# Patient Record
Sex: Female | Born: 1960 | Hispanic: No | Marital: Single | Smoking: Former smoker
Health system: Southern US, Community
[De-identification: ages and names within clinical notes are randomized; demographics above are authoritative.]

## PROBLEM LIST (undated history)

## (undated) DIAGNOSIS — Z87448 Personal history of other diseases of urinary system: Secondary | ICD-10-CM

## (undated) DIAGNOSIS — E119 Type 2 diabetes mellitus without complications: Secondary | ICD-10-CM

## (undated) DIAGNOSIS — I10 Essential (primary) hypertension: Secondary | ICD-10-CM

## (undated) DIAGNOSIS — K219 Gastro-esophageal reflux disease without esophagitis: Secondary | ICD-10-CM

## (undated) DIAGNOSIS — E669 Obesity, unspecified: Secondary | ICD-10-CM

## (undated) DIAGNOSIS — R609 Edema, unspecified: Secondary | ICD-10-CM

## (undated) HISTORY — PX: ABDOMINAL HYSTERECTOMY: SHX81

## (undated) HISTORY — DX: Personal history of other diseases of urinary system: Z87.448

## (undated) HISTORY — PX: TONSILLECTOMY: SUR1361

---

## 2001-05-22 ENCOUNTER — Other Ambulatory Visit: Admission: RE | Admit: 2001-05-22 | Discharge: 2001-05-22 | Payer: Self-pay | Admitting: Internal Medicine

## 2008-10-25 ENCOUNTER — Encounter: Admission: RE | Admit: 2008-10-25 | Discharge: 2008-10-25 | Payer: Self-pay | Admitting: Unknown Physician Specialty

## 2011-01-08 ENCOUNTER — Emergency Department: Payer: Self-pay | Admitting: Emergency Medicine

## 2014-04-24 ENCOUNTER — Emergency Department (HOSPITAL_COMMUNITY)
Admission: EM | Admit: 2014-04-24 | Discharge: 2014-04-24 | Disposition: A | Discharge status: Home / Self Care | Payer: 59 | Source: Home / Self Care | Attending: Family Medicine | Admitting: Family Medicine

## 2014-04-24 ENCOUNTER — Encounter (HOSPITAL_COMMUNITY): Payer: Self-pay | Admitting: Emergency Medicine

## 2014-04-24 DIAGNOSIS — L089 Local infection of the skin and subcutaneous tissue, unspecified: Secondary | ICD-10-CM

## 2014-04-24 HISTORY — DX: Edema, unspecified: R60.9

## 2014-04-24 HISTORY — DX: Gastro-esophageal reflux disease without esophagitis: K21.9

## 2014-04-24 HISTORY — DX: Essential (primary) hypertension: I10

## 2014-04-24 MED ORDER — SULFAMETHOXAZOLE-TMP DS 800-160 MG PO TABS: 1.0000 | ORAL_TABLET | Freq: Two times a day (BID) | ORAL | Status: DC

## 2014-04-24 MED ORDER — SULFAMETHOXAZOLE-TMP DS 800-160 MG PO TABS
1.0000 | ORAL_TABLET | Freq: Two times a day (BID) | ORAL | Status: DC
Start: 2014-04-24 — End: 2016-06-08

## 2014-04-24 NOTE — ED Provider Notes (Signed)
CSN: [id number]     Arrival date & time 04/24/14  1730 History   First MD Initiated Contact with Patient 04/24/14 1901     Chief Complaint  Patient presents with  . Toe Pain   (Consider location/radiation/quality/duration/timing/severity/associated sxs/prior Treatment) Patient is a 53 y.o. female presenting with toe pain. The history is provided by the patient. No language interpreter was used.  Toe Pain This is a new problem. The current episode started 12 to 24 hours ago. The problem occurs constantly. The problem has been gradually worsening. Nothing aggravates the symptoms. Nothing relieves the symptoms. She has tried nothing for the symptoms. The treatment provided no relief.  Pt complains of redness and swelling to 5th toe  Past Medical History  Diagnosis Date  . Fluid retention   . GERD (gastroesophageal reflux disease)   . Hypertension    Past Surgical History  Procedure Laterality Date  . Abdominal hysterectomy    . Tonsillectomy     History reviewed. No pertinent family history. History  Substance Use Topics  . Smoking status: Never Smoker   . Smokeless tobacco: Not on file  . Alcohol Use: Yes   OB History   Grav Para Term Preterm Abortions TAB SAB Ect Mult Living                 Review of Systems  Musculoskeletal: Positive for joint swelling.  All other systems reviewed and are negative.   Allergies  Review of patient's allergies indicates no known allergies.  Home Medications   Prior to Admission medications   Medication Sig Start Date End Date Taking? Authorizing Provider  Chlordiazepoxide-Clidinium (LIBRAX PO) Take by mouth.   Yes Historical Provider, MD  Furosemide (LASIX PO) Take by mouth.   Yes Historical Provider, MD  RABEprazole Sodium (ACIPHEX PO) Take by mouth.   Yes Historical Provider, MD  RAMIPRIL PO Take by mouth.   Yes Historical Provider, MD  SPIRONOLACTONE PO Take by mouth.   Yes Historical Provider, MD  Tolterodine Tartrate (DETROL LA  PO) Take by mouth.   Yes Historical Provider, MD  sulfamethoxazole-trimethoprim (BACTRIM DS) 800-160 MG per tablet Take 1 tablet by mouth 2 (two) times daily. 04/24/14   Elson AreasLeslie K Sofia, PA-C   BP 120/64  Pulse 61  Temp(Src) 98.6 F (37 C) (Oral)  Resp 16  SpO2 99% Physical Exam  Constitutional: She appears well-developed and well-nourished.  Musculoskeletal: She exhibits tenderness.  Swollen tender right 5th toe,  nv and ns intact  Neurological: She is alert.  Skin: Skin is warm.  Psychiatric: She has a normal mood and affect.    ED Course  Procedures (including critical care time) Labs Review Labs Reviewed - No data to display  Imaging Review No results found.   MDM   1. Toe infection    Pt advised to soak 20 minutes 4 times a day     Elson AreasLeslie K Sofia, [place] JerseyPA-C 04/24/14 1951

## 2014-04-24 NOTE — Discharge Instructions (Signed)
Cellulitis Cellulitis is an infection of the skin and the tissue beneath it. The infected area is usually red and tender. Cellulitis occurs most often in the arms and lower legs.  CAUSES  Cellulitis is caused by bacteria that enter the skin through cracks or cuts in the skin. The most common types of bacteria that cause cellulitis are Staphylococcus and Streptococcus. SYMPTOMS   Redness and warmth.  Swelling.  Tenderness or pain.  Fever. DIAGNOSIS  Your caregiver can usually determine what is wrong based on a physical exam. Blood tests may also be done. TREATMENT  Treatment usually involves taking an antibiotic medicine. HOME CARE INSTRUCTIONS   Take your antibiotics as directed. Finish them even if you start to feel better.  Keep the infected arm or leg elevated to reduce swelling.  Apply a warm cloth to the affected area up to 4 times per day to relieve pain.  Only take over-the-counter or prescription medicines for pain, discomfort, or fever as directed by your caregiver.  Keep all follow-up appointments as directed by your caregiver. SEEK MEDICAL CARE IF:   You notice red streaks coming from the infected area.  Your red area gets larger or turns dark in color.  Your bone or joint underneath the infected area becomes painful after the skin has healed.  Your infection returns in the same area or another area.  You notice a swollen bump in the infected area.  You develop new symptoms. SEEK IMMEDIATE MEDICAL CARE IF:   You have a fever.  You feel very sleepy.  You develop vomiting or diarrhea.  You have a general ill feeling (malaise) with muscle aches and pains. MAKE SURE YOU:   Understand these instructions.  Will watch your condition.  Will get help right away if you are not doing well or get worse. Document Released: 07/20/2005 Document Revised: 04/10/2012 Document Reviewed: 12/26/2011 Exit[employer] Patient Information 2015 [employer]. This information is  not intended to replace advice given to you by your health care provider. Make sure you discuss any questions you have with your health care provider.  

## 2014-04-24 NOTE — ED Notes (Signed)
Pt  Has  A  Swollen  Red small  Toe  That  Has  Been  Bothering   Her   Since  Last  Pm        -  denys  Any  Injury      Denys  Any  Known insect  Bite          The  Toe  Is  Sore  To  The  Touch  As  Well

## 2014-04-25 NOTE — ED Provider Notes (Signed)
Medical screening examination/treatment/procedure(s) were performed by a resident physician or non-physician practitioner and as the supervising physician I was immediately available for consultation/collaboration.  Saraann Enneking, MD    Yara Tomkinson S Kimyetta Flott, MD 04/25/14 1649 

## 2014-10-31 ENCOUNTER — Emergency Department (HOSPITAL_COMMUNITY): Payer: 59

## 2014-10-31 ENCOUNTER — Encounter (HOSPITAL_COMMUNITY): Payer: Self-pay | Admitting: Emergency Medicine

## 2014-10-31 ENCOUNTER — Emergency Department (HOSPITAL_COMMUNITY)
Admission: EM | Admit: 2014-10-31 | Discharge: 2014-10-31 | Disposition: A | Discharge status: Home / Self Care | Payer: 59 | Attending: Emergency Medicine | Admitting: Emergency Medicine

## 2014-10-31 DIAGNOSIS — I10 Essential (primary) hypertension: Secondary | ICD-10-CM | POA: Insufficient documentation

## 2014-10-31 DIAGNOSIS — R0602 Shortness of breath: Secondary | ICD-10-CM | POA: Insufficient documentation

## 2014-10-31 DIAGNOSIS — E119 Type 2 diabetes mellitus without complications: Secondary | ICD-10-CM | POA: Diagnosis not present

## 2014-10-31 DIAGNOSIS — R61 Generalized hyperhidrosis: Secondary | ICD-10-CM | POA: Diagnosis not present

## 2014-10-31 DIAGNOSIS — E669 Obesity, unspecified: Secondary | ICD-10-CM | POA: Diagnosis not present

## 2014-10-31 DIAGNOSIS — K219 Gastro-esophageal reflux disease without esophagitis: Secondary | ICD-10-CM | POA: Insufficient documentation

## 2014-10-31 DIAGNOSIS — Z79899 Other long term (current) drug therapy: Secondary | ICD-10-CM | POA: Diagnosis not present

## 2014-10-31 DIAGNOSIS — M7989 Other specified soft tissue disorders: Secondary | ICD-10-CM | POA: Insufficient documentation

## 2014-10-31 HISTORY — DX: Obesity, unspecified: E66.9

## 2014-10-31 HISTORY — DX: Type 2 diabetes mellitus without complications: E11.9

## 2014-10-31 LAB — COMPREHENSIVE METABOLIC PANEL
ALBUMIN: 3.3 g/dL — AB (ref 3.5–5.2)
ALT: 25 U/L (ref 0–35)
ANION GAP: 11 (ref 5–15)
AST: 38 U/L — AB (ref 0–37)
Alkaline Phosphatase: 113 U/L (ref 39–117)
BILIRUBIN TOTAL: 0.6 mg/dL (ref 0.3–1.2)
BUN: 9 mg/dL (ref 6–23)
CALCIUM: 9.2 mg/dL (ref 8.4–10.5)
CHLORIDE: 107 meq/L (ref 96–112)
CO2: 23 mmol/L (ref 19–32)
Creatinine, Ser: 0.83 mg/dL (ref 0.50–1.10)
GFR, EST NON AFRICAN AMERICAN: 79 mL/min — AB (ref >90)
GLUCOSE: 125 mg/dL — AB (ref 70–99)
Potassium: 3.5 mmol/L (ref 3.5–5.1)
Sodium: 141 mmol/L (ref 135–145)
Total Protein: 7.1 g/dL (ref 6.0–8.3)

## 2014-10-31 LAB — I-STAT TROPONIN, ED
TROPONIN I, POC: 0 ng/mL (ref 0.00–0.08)
Troponin i, poc: 0 ng/mL (ref 0.00–0.08)

## 2014-10-31 LAB — CBC WITH DIFFERENTIAL/PLATELET
BASOS ABS: 0 10*3/uL (ref 0.0–0.1)
BASOS PCT: 0 % (ref 0–1)
EOS ABS: 0.2 10*3/uL (ref 0.0–0.7)
EOS PCT: 2 % (ref 0–5)
HCT: 41 % (ref 36.0–46.0)
Hemoglobin: 13.7 g/dL (ref 12.0–15.0)
LYMPHS PCT: 47 % — AB (ref 12–46)
Lymphs Abs: 4.9 10*3/uL — ABNORMAL HIGH (ref 0.7–4.0)
MCH: 29.5 pg (ref 26.0–34.0)
MCHC: 33.4 g/dL (ref 30.0–36.0)
MCV: 88.4 fL (ref 78.0–100.0)
MONOS PCT: 6 % (ref 3–12)
Monocytes Absolute: 0.7 10*3/uL (ref 0.1–1.0)
NEUTROS PCT: 45 % (ref 43–77)
Neutro Abs: 4.8 10*3/uL (ref 1.7–7.7)
Platelets: 266 10*3/uL (ref 150–400)
RBC: 4.64 MIL/uL (ref 3.87–5.11)
RDW: 14.1 % (ref 11.5–15.5)
WBC: 10.5 10*3/uL (ref 4.0–10.5)

## 2014-10-31 LAB — D-DIMER, QUANTITATIVE: D-Dimer, Quant: 0.56 ug/mL-FEU — ABNORMAL HIGH (ref 0.00–0.48)

## 2014-10-31 LAB — BRAIN NATRIURETIC PEPTIDE: B NATRIURETIC PEPTIDE 5: 25.4 pg/mL (ref 0.0–100.0)

## 2014-10-31 MED ORDER — IOHEXOL 350 MG/ML SOLN
100.0000 mL | Freq: Once | INTRAVENOUS | Status: AC | PRN
  Administered 2014-10-31: 100 mL via INTRAVENOUS

## 2014-10-31 MED ORDER — LORAZEPAM 2 MG/ML IJ SOLN
1.0000 mg | Freq: Once | INTRAMUSCULAR | Status: AC
  Administered 2014-10-31: 1 mg via INTRAVENOUS
  Filled 2014-10-31: qty 1

## 2014-10-31 NOTE — ED Notes (Signed)
Dr. Yao at the bedside. 

## 2014-10-31 NOTE — ED Notes (Signed)
Dr yao at the bedside 

## 2014-10-31 NOTE — ED Notes (Signed)
Spoke with Dr. Silverio LayYao in regards to plan of care. Prepared patient for CTA. Ativan ordered to manage anxiety.

## 2014-10-31 NOTE — ED Notes (Signed)
Pt. reports intermittent SOB for several weeks worse with exertion and lying , occasional dry cough , no chest pain , denies fever or chills.

## 2014-10-31 NOTE — ED Provider Notes (Signed)
CSN: [id number]     Arrival date & time 10/31/14  0000 History  This chart was scribed for Heather Canalavid H Yao, MD by Murriel HopperAlec Bankhead, ED Scribe. This patient was seen in room B15C/B15C and the patient's care was started at 1:29 AM.    Chief Complaint  Patient presents with  . Shortness of Breath    The history is provided by the patient. No language interpreter was used.     HPI Comments: Heather Mullen is a 54 y.o. female who presents to the Emergency Department complaining of intermittent SOB with associated diaphoresis that have been present for several weeks. Pt states that episodes occur whenever she is sitting down or laying down. Pt states that she was laying down earlier yesterday evening in her bed 3-4 hours PTA when she began to have SOB. Pt states that she uses a sleep apnea machine while sleeping, and notes that she began to have symptoms. Pt states that she then got up and walked around, and her symptoms did not change. Pt also notes swelling in her ankles and feet, but has never been diagnosed with CHF. Pt denies chest pain, fever, or chills.     Past Medical History  Diagnosis Date  . Fluid retention   . GERD (gastroesophageal reflux disease)   . Hypertension   . Obesity   . Diabetes mellitus without complication    Past Surgical History  Procedure Laterality Date  . Abdominal hysterectomy    . Tonsillectomy     No family history on file. History  Substance Use Topics  . Smoking status: Never Smoker   . Smokeless tobacco: Not on file  . Alcohol Use: Yes   OB History    No data available     Review of Systems  Constitutional: Positive for diaphoresis. Negative for fever and chills.  Respiratory: Positive for shortness of breath.   Cardiovascular: Positive for leg swelling. Negative for chest pain.  All other systems reviewed and are negative.     Allergies  Review of patient's allergies indicates no known allergies.  Home Medications   Prior to Admission  medications   Medication Sig Start Date End Date Taking? Authorizing Provider  atorvastatin (LIPITOR) 80 MG tablet Take 80 mg by mouth daily.   Yes Historical Provider, MD  Furosemide (LASIX PO) Take 40 mg by mouth.    Yes Historical Provider, MD  glucosamine-chondroitin 500-400 MG tablet Take 1 tablet by mouth 2 (two) times daily.   Yes Historical Provider, MD  Multiple Vitamin (MULTIVITAMIN) capsule Take 1 capsule by mouth daily.   Yes Historical Provider, MD  pantoprazole (PROTONIX) 40 MG tablet Take 40 mg by mouth daily.   Yes Historical Provider, MD  RAMIPRIL PO Take by mouth.   Yes Historical Provider, MD  SPIRONOLACTONE PO Take 25 mg by mouth daily.    Yes Historical Provider, MD  sucralfate (CARAFATE) 1 G tablet Take 1 g by mouth 2 (two) times daily.   Yes Historical Provider, MD  Tolterodine Tartrate (DETROL LA PO) Take 4 mg by mouth.    Yes Historical Provider, MD  valACYclovir (VALTREX) 500 MG tablet Take 500 mg by mouth daily.   Yes Historical Provider, MD  vitamin A 7500 UNIT capsule Take 7,500 Units by mouth daily.   Yes Historical Provider, MD  Chlordiazepoxide-Clidinium (LIBRAX PO) Take by mouth.    Historical Provider, MD  RABEprazole Sodium (ACIPHEX PO) Take by mouth.    Historical Provider, MD  sulfamethoxazole-trimethoprim (BACTRIM DS)  800-160 MG per tablet Take 1 tablet by mouth 2 (two) times daily. 04/24/14   Elson Areas, PA-C   BP 123/77 mmHg  Pulse 66  Temp(Src) 98 F (36.7 C) (Oral)  Resp 17  SpO2 97% Physical Exam  Constitutional: She is oriented to person, place, and time. She appears well-developed and well-nourished.  HENT:  Head: Normocephalic and atraumatic.  Cardiovascular: Normal rate, regular rhythm and normal heart sounds.   Pulmonary/Chest: Effort normal.  Abdominal: Soft. She exhibits no distension.  Musculoskeletal:  Right leg pitting edema 2+ edema on right leg 1+ edema on left leg   Neurological: She is alert and oriented to person, place,  and time.  Skin: Skin is warm and dry.  Psychiatric: She has a normal mood and affect.  Nursing note and vitals reviewed.   ED Course  Procedures (including critical care time)  DIAGNOSTIC STUDIES: Oxygen Saturation is 96% on RA, normal by my interpretation.    COORDINATION OF CARE: 1:35 AM Discussed treatment plan with pt at bedside and pt agreed to plan.   Labs Review Labs Reviewed  CBC WITH DIFFERENTIAL - Abnormal; Notable for the following:    Lymphocytes Relative 47 (*)    Lymphs Abs 4.9 (*)    All other components within normal limits  COMPREHENSIVE METABOLIC PANEL - Abnormal; Notable for the following:    Glucose, Bld 125 (*)    Albumin 3.3 (*)    AST 38 (*)    GFR calc non Af Amer 79 (*)    All other components within normal limits  D-DIMER, QUANTITATIVE - Abnormal; Notable for the following:    D-Dimer, Quant 0.56 (*)    All other components within normal limits  BRAIN NATRIURETIC PEPTIDE  I-STAT TROPOININ, ED  Rosezena Sensor, ED    Imaging Review Dg Chest 2 View  10/31/2014   CLINICAL DATA:  Shortness of breath and chest tightness for 3 weeks. Symptoms worsening and slight cough this evening.  EXAM: CHEST  2 VIEW  COMPARISON:  None.  FINDINGS: Normal heart size and pulmonary vascularity. No focal airspace disease or consolidation in the lungs. No blunting of costophrenic angles. No pneumothorax. Mediastinal contours appear intact. Degenerative changes in the spine.  IMPRESSION: No active cardiopulmonary disease.   Electronically Signed   By: Burman Nieves M.D.   On: 10/31/2014 00:46   Ct Angio Chest Pe W/cm &/or Wo Cm  10/31/2014   CLINICAL DATA:  Shortness of breath for several weeks. Worsens with exertion or when lying flat. D-dimer is 0.56.  EXAM: CT ANGIOGRAPHY CHEST WITH CONTRAST  TECHNIQUE: Multidetector CT imaging of the chest was performed using the standard protocol during bolus administration of intravenous contrast. Multiplanar CT image reconstructions  and MIPs were obtained to evaluate the vascular anatomy.  CONTRAST:  OMNIPAQUE IOHEXOL 350 MG/ML SOLN  COMPARISON:  Chest radiograph 10/31/2014  FINDINGS: Technically adequate study with good opacification of the central and segmental pulmonary arteries. No focal filling defects. No evidence of significant pulmonary embolus.  Normal heart size. Normal caliber thoracic aorta. No evidence of aortic dissection. Esophagus is decompressed. No significant lymphadenopathy in the chest.  Evaluation of lungs is limited due to respiratory motion artifact. No gross consolidation is demonstrated. No pleural effusions. No pneumothorax.  Included portions of the upper abdominal organs are grossly unremarkable. Degenerative changes throughout the thoracic spine. No destructive bone lesions appreciated.  Review of the MIP images confirms the above findings.  IMPRESSION: No evidence of significant pulmonary  embolus. Limited evaluation of lungs due to motion artifact but no gross evidence of consolidation.   Electronically Signed   By: Burman Nieves M.D.   On: 10/31/2014 03:44     EKG Interpretation   Date/Time:  Friday October 31 2014 00:05:59 EST Ventricular Rate:  90 PR Interval:  160 QRS Duration: 92 QT Interval:  364 QTC Calculation: 445 R Axis:   -5 Text Interpretation:  Normal sinus rhythm Cannot rule out Anterior infarct  , age undetermined Abnormal ECG No previous ECGs available Confirmed by  YAO  MD, DAVID ([id number]) on 10/31/2014 1:26:44 AM      MDM   Final diagnoses:  Shortness of breath    Sybel A Erck is a 54 y.o. female here with intermittent shortness of breath for 2 weeks. Consider PE vs CHF less likely to be ACS. Will get labs, d-dimer, BNP, cxr, delta trop.   4:43 AM Delta trop neg. D-dimer elevated. CT showed no PE. Never hypoxic. Well appearing. I think it can be from OSA. She has echo scheduled next week. Ok for d/c.   I personally performed the services described in this  documentation, which was scribed in my presence. The recorded information has been reviewed and is accurate.   Heather Canal, MD 10/31/14 [phone]

## 2014-10-31 NOTE — Discharge Instructions (Signed)
Follow up with your doctor.   Get your echo as scheduled.   Return to ER if you have worse shortness of breath, chest pain.

## 2014-10-31 NOTE — ED Notes (Signed)
Patient states she has a cardiac echo scheduled for Nov 08, 2014.

## 2015-07-01 ENCOUNTER — Other Ambulatory Visit: Payer: Self-pay | Admitting: Dermatology

## 2016-06-08 ENCOUNTER — Encounter: Payer: Self-pay | Admitting: Podiatry

## 2016-06-08 ENCOUNTER — Ambulatory Visit (INDEPENDENT_AMBULATORY_CARE_PROVIDER_SITE_OTHER): Payer: 59 | Admitting: Podiatry

## 2016-06-08 ENCOUNTER — Ambulatory Visit (INDEPENDENT_AMBULATORY_CARE_PROVIDER_SITE_OTHER): Payer: 59

## 2016-06-08 VITALS — BP 112/67 | HR 69 | Resp 16 | Ht 64.0 in | Wt 298.0 lb

## 2016-06-08 DIAGNOSIS — M722 Plantar fascial fibromatosis: Secondary | ICD-10-CM | POA: Diagnosis not present

## 2016-06-08 DIAGNOSIS — M25571 Pain in right ankle and joints of right foot: Secondary | ICD-10-CM | POA: Diagnosis not present

## 2016-06-08 DIAGNOSIS — M7661 Achilles tendinitis, right leg: Secondary | ICD-10-CM | POA: Diagnosis not present

## 2016-06-08 MED ORDER — TRIAMCINOLONE ACETONIDE 10 MG/ML IJ SUSP
10.0000 mg | Freq: Once | INTRAMUSCULAR | Status: AC
  Administered 2016-06-08: 10 mg

## 2016-06-08 NOTE — Patient Instructions (Signed)

## 2016-06-08 NOTE — Progress Notes (Signed)
   Subjective:    Patient ID: Heather MaclachlanIris A Mullen, female    DOB: 04/10/1961, 55 y.o.   MRN: [id number]  HPI Chief Complaint  Patient presents with  . Ankle Pain    Right foot-medial side; swelling & soreness; x2 months  . Foot Pain    Right foot; heel & back of heel; x3 weeks      Review of Systems  Constitutional: Positive for fatigue.  HENT: Positive for sinus pressure.   Hematological: Bruises/bleeds easily.  All other systems reviewed and are negative.      Objective:   Physical Exam        Assessment & Plan:

## 2016-06-09 NOTE — Progress Notes (Signed)
Subjective:     Patient ID: Heather MaclachlanIris A Mullen, female   DOB: 01/12/1961, 55 y.o.   MRN: [id number]  HPI patient presents with exquisite discomfort in the posterior aspect of the right heel medial side mild discomfort plantar and medial ankle. She is very obese which is complicating factor and it's making it hard for her to walk   Review of Systems  All other systems reviewed and are negative.      Objective:   Physical Exam  Constitutional: She is oriented to person, place, and time.  Cardiovascular: Intact distal pulses.   Musculoskeletal: Normal range of motion.  Neurological: She is oriented to person, place, and time.  Skin: Skin is warm.  Nursing note and vitals reviewed.  neurovascular status intact muscle strength adequate range of motion was within normal limits with patient having mild equinus condition. Patient's found to have inflammatory changes of the posterior heel medial side and is noted to have mild plantar heel pain with depression of the arch of a significant nature noted. Patient's found have good digital perfusion and is well oriented 3     Assessment:     Inflammatory tendinitis of the Achilles medial side with central and lateral band strong and intact with also mild plantar pain and foot structural issues    Plan:     H&P x-rays reviewed condition discussed. I do think that careful injection can be administered and I explained procedure and risk and patient wants the procedure. I went ahead today and I carefully injected the medial side 3 mg dexamethasone Kenalog 5 mg Xylocaine advised on gradual stretch but not doing any activity for a few days and reappoint for us to recheck again in the next 2-3 weeks  X-ray report was negative for signs of fracture and didn't indicate spurring of the posterior plantar heel

## 2016-07-01 ENCOUNTER — Encounter: Payer: Self-pay | Admitting: Podiatry

## 2016-07-01 ENCOUNTER — Ambulatory Visit (INDEPENDENT_AMBULATORY_CARE_PROVIDER_SITE_OTHER): Payer: 59 | Admitting: Podiatry

## 2016-07-01 VITALS — BP 94/57 | HR 71 | Resp 16

## 2016-07-01 DIAGNOSIS — M25571 Pain in right ankle and joints of right foot: Secondary | ICD-10-CM

## 2016-07-01 DIAGNOSIS — M7661 Achilles tendinitis, right leg: Secondary | ICD-10-CM | POA: Diagnosis not present

## 2016-07-01 DIAGNOSIS — R600 Localized edema: Secondary | ICD-10-CM

## 2016-07-03 NOTE — Progress Notes (Signed)
Subjective:     Patient ID: Heather Mullen, female   DOB: 03/15/1961, 55 y.o.   MRN: [id number]  HPI patient presents still having some swelling on the top and side of the right foot and states that it some better but still quite sore   Review of Systems     Objective:   Physical Exam Neurovascular status intact negative Homan sign was noted with continued edema dorsal right foot with mild discomfort into the sinus tarsi right and negative Homans sign noted    Assessment:     Probability for inflammatory condition localized in nature with inflammation    Plan:     Reviewed conditions and if any swelling of the lower leg should start or any shortness of breath she is to go straight to the emergency room and today I applied Unna boot to try to reduce the edema and advised on take it off 4 days or earlier if any changes should occur

## 2016-07-04 ENCOUNTER — Ambulatory Visit (HOSPITAL_COMMUNITY): Admission: RE | Admit: 2016-07-04 | Payer: Commercial Managed Care - PPO | Source: Ambulatory Visit

## 2016-07-05 ENCOUNTER — Ambulatory Visit (HOSPITAL_COMMUNITY)
Admission: RE | Admit: 2016-07-05 | Discharge: 2016-07-05 | Disposition: A | Discharge status: Home / Self Care | Payer: 59 | Source: Ambulatory Visit | Attending: Podiatry | Admitting: Podiatry

## 2016-07-05 DIAGNOSIS — R600 Localized edema: Secondary | ICD-10-CM

## 2016-07-07 ENCOUNTER — Telehealth: Payer: Self-pay

## 2016-07-07 ENCOUNTER — Ambulatory Visit: Payer: 59 | Admitting: Podiatry

## 2016-07-07 NOTE — Telephone Encounter (Signed)
Attempt to return pt phone call about anklet fitting too tightly, to advise her to take the tight fitting anklet off, come to the office to pick up a larger compression anklet and an ace wrap, the person answering the phone told me that I had the wrong number.

## 2016-07-19 ENCOUNTER — Emergency Department (HOSPITAL_COMMUNITY)
Admission: EM | Admit: 2016-07-19 | Discharge: 2016-07-19 | Disposition: A | Discharge status: Home / Self Care | Payer: 59 | Attending: Emergency Medicine | Admitting: Emergency Medicine

## 2016-07-19 ENCOUNTER — Encounter (HOSPITAL_COMMUNITY): Payer: Self-pay | Admitting: Emergency Medicine

## 2016-07-19 ENCOUNTER — Emergency Department (HOSPITAL_COMMUNITY): Payer: 59

## 2016-07-19 DIAGNOSIS — E119 Type 2 diabetes mellitus without complications: Secondary | ICD-10-CM | POA: Insufficient documentation

## 2016-07-19 DIAGNOSIS — R079 Chest pain, unspecified: Secondary | ICD-10-CM | POA: Diagnosis present

## 2016-07-19 DIAGNOSIS — R0789 Other chest pain: Secondary | ICD-10-CM | POA: Diagnosis not present

## 2016-07-19 DIAGNOSIS — Z87891 Personal history of nicotine dependence: Secondary | ICD-10-CM | POA: Diagnosis not present

## 2016-07-19 DIAGNOSIS — Z79899 Other long term (current) drug therapy: Secondary | ICD-10-CM | POA: Diagnosis not present

## 2016-07-19 DIAGNOSIS — I10 Essential (primary) hypertension: Secondary | ICD-10-CM | POA: Diagnosis not present

## 2016-07-19 LAB — URINALYSIS, ROUTINE W REFLEX MICROSCOPIC
BILIRUBIN URINE: NEGATIVE
Glucose, UA: NEGATIVE mg/dL
Hgb urine dipstick: NEGATIVE
Ketones, ur: NEGATIVE mg/dL
LEUKOCYTES UA: NEGATIVE
NITRITE: NEGATIVE
PH: 7 (ref 5.0–8.0)
Protein, ur: NEGATIVE mg/dL
SPECIFIC GRAVITY, URINE: 1.006 (ref 1.005–1.030)

## 2016-07-19 LAB — BASIC METABOLIC PANEL
Anion gap: 8 (ref 5–15)
BUN: 9 mg/dL (ref 6–20)
CALCIUM: 9.6 mg/dL (ref 8.9–10.3)
CO2: 25 mmol/L (ref 22–32)
CREATININE: 0.91 mg/dL (ref 0.44–1.00)
Chloride: 103 mmol/L (ref 101–111)
GFR calc non Af Amer: >60 mL/min (ref >60)
GLUCOSE: 114 mg/dL — AB (ref 65–99)
Potassium: 3.4 mmol/L — ABNORMAL LOW (ref 3.5–5.1)
Sodium: 136 mmol/L (ref 135–145)

## 2016-07-19 LAB — CBC
HCT: 44 % (ref 36.0–46.0)
Hemoglobin: 14.4 g/dL (ref 12.0–15.0)
MCH: 29 pg (ref 26.0–34.0)
MCHC: 32.7 g/dL (ref 30.0–36.0)
MCV: 88.5 fL (ref 78.0–100.0)
PLATELETS: 271 10*3/uL (ref 150–400)
RBC: 4.97 MIL/uL (ref 3.87–5.11)
RDW: 14 % (ref 11.5–15.5)
WBC: 8.7 10*3/uL (ref 4.0–10.5)

## 2016-07-19 LAB — I-STAT TROPONIN, ED
TROPONIN I, POC: 0 ng/mL (ref 0.00–0.08)
Troponin i, poc: 0.02 ng/mL (ref 0.00–0.08)

## 2016-07-19 MED ORDER — GI COCKTAIL ~~LOC~~
30.0000 mL | Freq: Once | ORAL | Status: AC
  Administered 2016-07-19: 30 mL via ORAL
  Filled 2016-07-19: qty 30

## 2016-07-19 NOTE — ED Provider Notes (Signed)
[facility]-EMERGENCY DEPT Provider Note   CSN: [id number] Arrival date & time: 07/19/16  0022     History   Chief Complaint Chief Complaint  Patient presents with  . Chest Pain    HPI Heather Mullen is a 55 y.o. female.  HPI   Patient is a 55 year old female with history of hypertension, obesity, acid reflux, she presents emergency Department with chief complaint of chest pain that began last night after explaining 9:30 PM, while at rest and going to bed tonight. She describes the pain as mild chest tightness and some shooting between her shoulder blades with associated anxiety, hyperventilation and palpitations.  She felt flushed and symptoms persisted for roughly 2 hours, unchanged with rest or exertion. She called her son to come over to her home and asked him to bring her to the ER. She states that time of arrival last night at roughly 11:30 her symptoms had somewhat alleviated but then became intermittent. She denies any other associated symptoms including no new lower extremity edema, no orthopnea, near syncope, nausea, diaphoresis.  She states that it felt "difficult to breathe, I couldn't breath right, I didn't really feel short of breath," and no change in pain with inspiration.  She initially presented to the ER complaining of 7/10 chest pain, however at the time of my evaluation she states it was not chest pain, but more an anxious and uncomfortable.  She was recently represcribed phentermine and Topamax for weight loss, which she began taking 4 days ago.  She had taken phentermine in the past with only side effect of insomnia, but did not have any chest pain or palpitations with it.  Since taking it again she has dry mouth and excessive thirst. She denies any caffeine use states she's been drinking a lot of water.  Patient denies any current shortness of breath, cough, wheeze. She denies fever, chills, sweats.  No exertional chest pain, shortness of breath or near syncope. She reports  seeing a cardiologist in the past without any cardiac history. She states that she is previously treated for diabetes but A1C improved and she no longer needs treatment.  She has a history of right lower extremity edema that is unchanged from her baseline. She has no other acute or associated complaints.   Past Medical History:  Diagnosis Date  . Diabetes mellitus without complication (HCC)   . Fluid retention   . GERD (gastroesophageal reflux disease)   . H/O bladder problems   . Hypertension   . Obesity   . Obesity     There are no active problems to display for this patient.   Past Surgical History:  Procedure Laterality Date  . ABDOMINAL HYSTERECTOMY    . TONSILLECTOMY      OB History    No data available       Home Medications    Prior to Admission medications   Medication Sig Start Date End Date Taking? Authorizing Provider  atorvastatin (LIPITOR) 80 MG tablet Take 80 mg by mouth daily.   Yes Historical Provider, MD  furosemide (LASIX) 40 MG tablet Take 40 mg by mouth daily.   Yes Historical Provider, MD  GLUCOSAMINE-CHONDROIT-VIT C-MN PO Take 1,500 mg by mouth daily.    Yes Historical Provider, MD  Multiple Vitamin (MULTIVITAMIN WITH MINERALS) TABS tablet Take 1 tablet by mouth daily.   Yes Historical Provider, MD  Omega-3 Fatty Acids (FISH OIL PO) Take 1 capsule by mouth daily.    Yes Historical Provider, MD  RABEprazole (ACIPHEX) 20 MG tablet Take 20 mg by mouth daily.   Yes Historical Provider, MD  ramipril (ALTACE) 10 MG capsule Take 10 mg by mouth daily.   Yes Historical Provider, MD  spironolactone (ALDACTONE) 25 MG tablet Take 25 mg by mouth daily.   Yes Historical Provider, MD  tolterodine (DETROL LA) 4 MG 24 hr capsule Take 4 mg by mouth daily. 06/30/16  Yes Historical Provider, MD  valACYclovir (VALTREX) 500 MG tablet Take 500 mg by mouth daily.   Yes Historical Provider, MD    Family History No family history on file.  Social History Social History    Substance Use Topics  . Smoking status: Former Games developer  . Smokeless tobacco: Never Used  . Alcohol use Yes     Allergies   Review of patient's allergies indicates no known allergies.   Review of Systems Review of Systems  All other systems reviewed and are negative.    Physical Exam Updated Vital Signs BP 135/91   Pulse 64   Temp 98.5 F (36.9 C) (Oral)   Resp 15   Ht 5\' 4"  (1.626 m)   Wt (!) 140.6 kg   SpO2 100%   BMI 53.21 kg/m   Physical Exam  Constitutional: She is oriented to person, place, and time. She appears well-developed and well-nourished. No distress.  HENT:  Head: Normocephalic and atraumatic.  Nose: Nose normal.  Mouth/Throat: Oropharynx is clear and moist. No oropharyngeal exudate.  Eyes: Conjunctivae and EOM are normal. Pupils are equal, round, and reactive to light. Right eye exhibits no discharge. Left eye exhibits no discharge. No scleral icterus.  Neck: Normal range of motion. Neck supple. No JVD present. No tracheal deviation present.  Cardiovascular: Normal rate, regular rhythm, normal heart sounds and intact distal pulses.  Exam reveals no gallop and no friction rub.   No murmur heard. Regular rate and rhythm, no murmur, gallop or rub, symmetrical radial and dorsal pedis pulses 2+, right ankle and pedal edema, nonpitting, no erythema, no tenderness  Pulmonary/Chest: Effort normal and breath sounds normal. No stridor. No respiratory distress. She has no wheezes. She has no rales. She exhibits no tenderness.  Abdominal: Soft. Bowel sounds are normal. She exhibits no distension and no mass. There is no tenderness. There is no rebound and no guarding.  Obese abdomen, soft, nontender, nondistended, normal bowel sounds 4  Musculoskeletal: Normal range of motion. She exhibits edema. She exhibits no tenderness.  Lymphadenopathy:    She has no cervical adenopathy.  Neurological: She is alert and oriented to person, place, and time. She exhibits normal  muscle tone. Coordination normal.  Skin: Skin is warm and dry. Capillary refill takes less than 2 seconds. No rash noted. She is not diaphoretic. No erythema. No pallor.  Psychiatric: She has a normal mood and affect. Her behavior is normal. Judgment and thought content normal.  Nursing note and vitals reviewed.    ED Treatments / Results  Labs (all labs ordered are listed, but only abnormal results are displayed) Labs Reviewed  BASIC METABOLIC PANEL - Abnormal; Notable for the following:       Result Value   Potassium 3.4 (*)    Glucose, Bld 114 (*)    All other components within normal limits  CBC  URINALYSIS, ROUTINE W REFLEX MICROSCOPIC (NOT AT [facility])  Rosezena Sensor, ED  Rosezena Sensor, ED    EKG  EKG Interpretation  Date/Time:  Tuesday July 19 2016 00:33:04 EDT Ventricular Rate:  89 PR  Interval:  166 QRS Duration: 88 QT Interval:  372 QTC Calculation: 452 R Axis:   -18 Text Interpretation:  Normal sinus rhythm Normal ECG When compared with ECG of 10/31/2014, No significant change was found Confirmed by De[facility] Health CenterGLICK  MD, DAVID ([id number]) on 07/19/2016 1:11:27 AM       Radiology Dg Chest 2 View  Result Date: 07/19/2016 CLINICAL DATA:  55 year old female with chest pain and shortness of breath EXAM: CHEST  2 VIEW COMPARISON:  Chest CT dated 10/31/2014 FINDINGS: The heart size and mediastinal contours are within normal limits. Both lungs are clear. The visualized skeletal structures are unremarkable. IMPRESSION: No active cardiopulmonary disease. Electronically Signed   By: Elgie CollardArash  Radparvar M.D.   On: 07/19/2016 01:57    Procedures Procedures (including critical care time)  Medications Ordered in ED Medications  gi cocktail (Maalox,Lidocaine,Donnatal) (30 mLs Oral Given 07/19/16 0716)     Initial Impression / Assessment and Plan / ED Course  I have reviewed the triage vital signs and the nursing notes.  Pertinent labs & imaging results that were available during my  care of the patient were reviewed by me and considered in my medical decision making (see chart for details).  Clinical Course  55 year old female presents with chief complaint of chest pain, recently started taking phentermine and Topamax, describes symptoms of anxiousness, hyperventilation, palpitations, denies shortness of breath, near syncope, nausea, diaphoresis. Cardiac workup initiated at triage, EKG without acute changes from prior, nonischemic, normal sinus rhythm, chest x-ray negative, basic labs unremarkable, negative troponin 2.  History and workup non-concerning for ACS, favor medication side effect with stimulant, patient will stop her medications and follow-up with her PCP.   Patient well-appearing at time of discharge was stable vital signs. She is in agreement with plan to discharge home, follow up PCP and stop medications. Return precautions reviewed patient verbalizes understanding.  Final Clinical Impressions(s) / ED Diagnoses   Final diagnoses:  Atypical chest pain    New Prescriptions Discharge Medication List as of 07/19/2016  7:51 AM       Danelle BerryLeisa Lovenia Debruler, PA-C 07/19/16 [id number]    Zadie Rhineonald Wickline, MD 07/20/16 0025

## 2016-07-19 NOTE — ED Triage Notes (Signed)
Pt. reports central chest pain /discomfort radiating to right upper back with SOB , denies emesis or diaphoresis , pt. added urinary discomfort .

## 2016-07-22 ENCOUNTER — Encounter: Payer: Self-pay | Admitting: Podiatry

## 2016-07-22 ENCOUNTER — Ambulatory Visit (INDEPENDENT_AMBULATORY_CARE_PROVIDER_SITE_OTHER): Payer: 59 | Admitting: Podiatry

## 2016-07-22 DIAGNOSIS — M25571 Pain in right ankle and joints of right foot: Secondary | ICD-10-CM

## 2016-07-22 DIAGNOSIS — R600 Localized edema: Secondary | ICD-10-CM | POA: Diagnosis not present

## 2016-07-22 NOTE — Progress Notes (Signed)
g

## 2016-07-22 NOTE — Progress Notes (Signed)
Subjective:     Patient ID: Heather MaclachlanIris A Mullen, female   DOB: 10/18/1961, 55 y.o.   MRN: [id number]  HPI patient states she continues to have discomfort in right foot was swelling but states the pain itself is much better but swelling is still present   Review of Systems     Objective:   Physical Exam Neurovascular status intact negative Homans sign noted with nondescript swelling of the forefoot right with no indications currently of venous disease. Patient has been trying fluid pills and it seems to be making slight difference    Assessment:     Continued moderate edema right foot with no indications of clot or no indications of other pathology    Plan:     Continue elevation compression and reduced activity and medication. If symptoms persist I want her to see of vein doctor to rule out any other form of venous disease and possibly will require compression stockings

## 2018-06-24 IMAGING — DX DG CHEST 2V
2 series · 2 of 2 positions shown · non-contrast
Comparison: Chest CT dated 10/31/2014

CLINICAL DATA: 55-year-old female with chest pain and shortness of
breath

EXAM:
CHEST  2 VIEW

[chest pa]
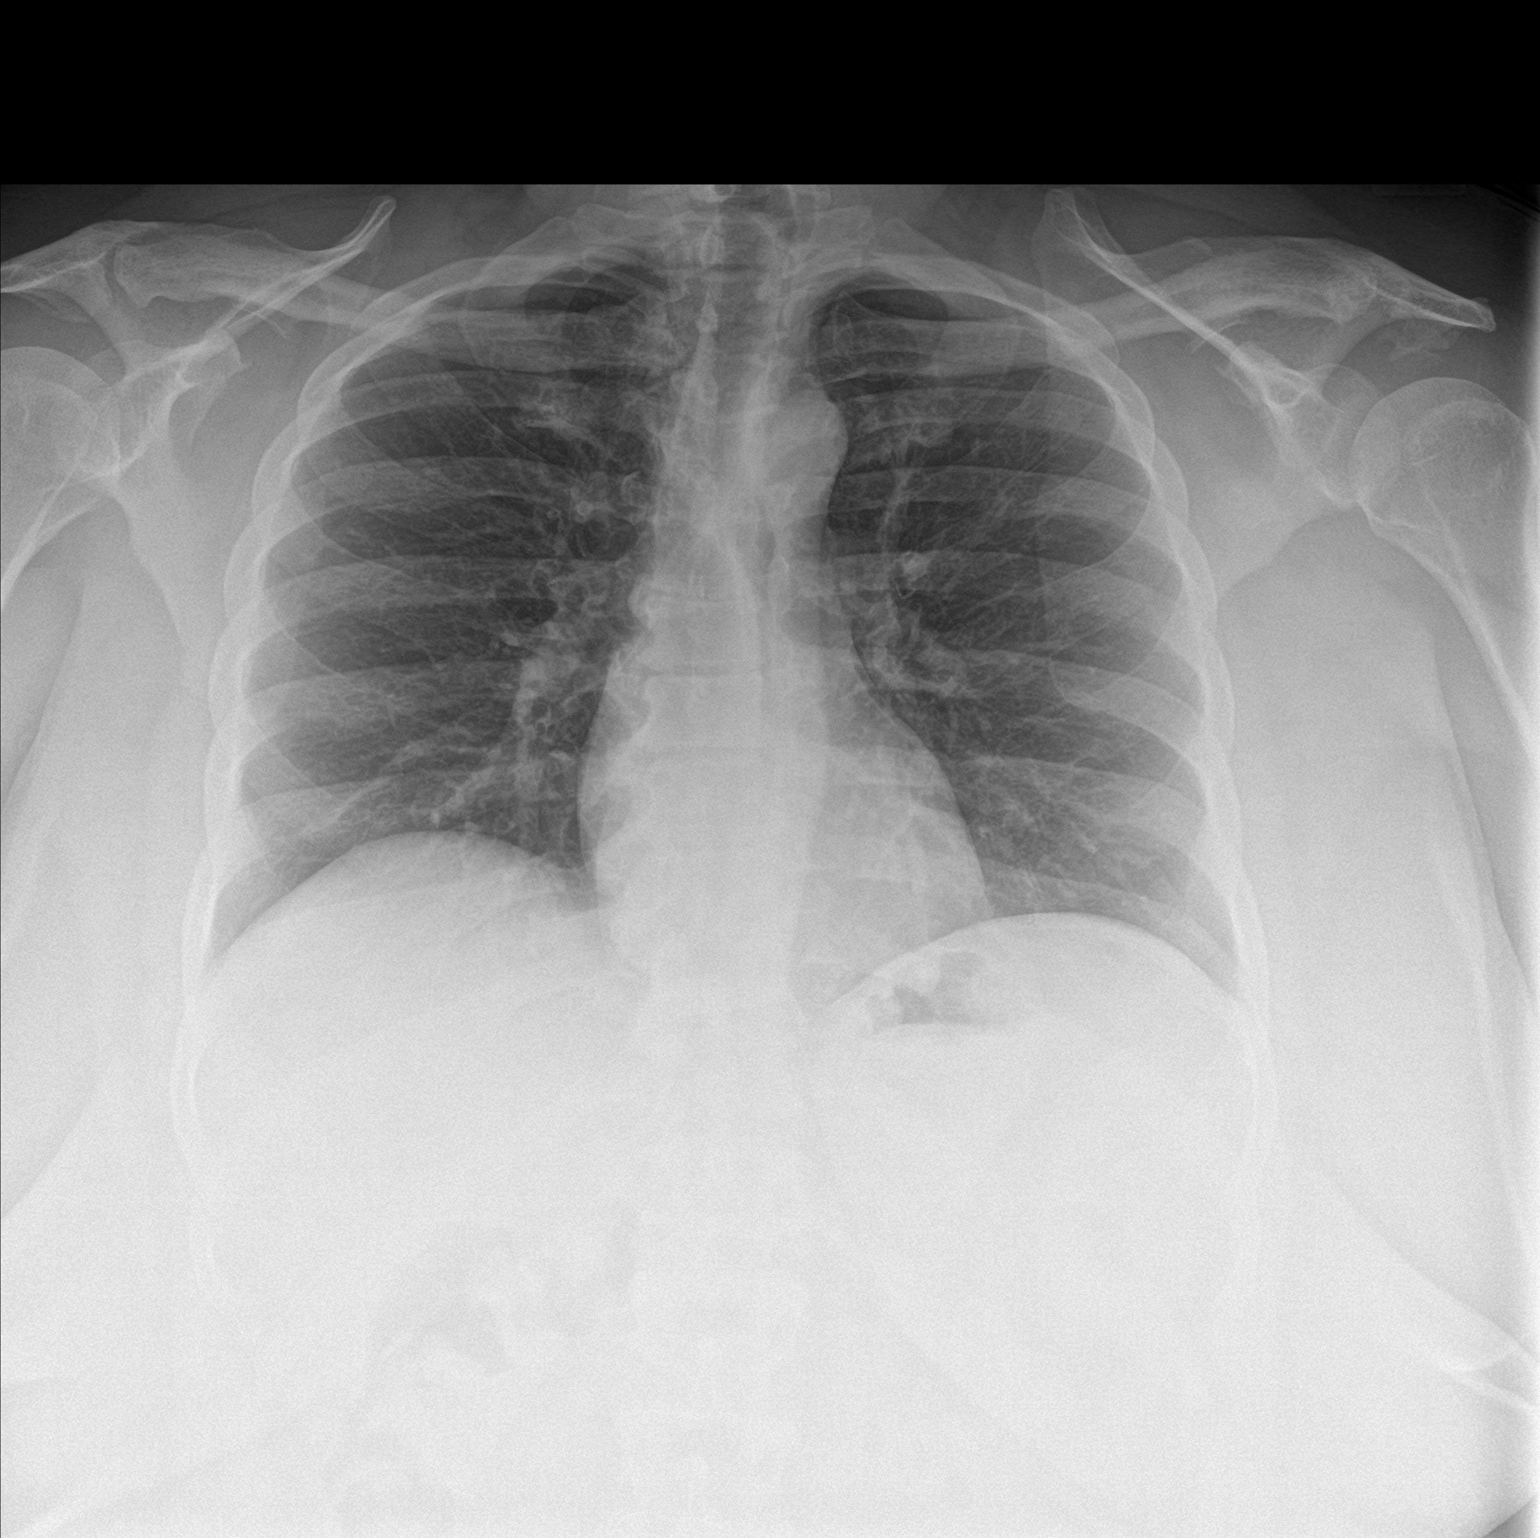

[chest lat]
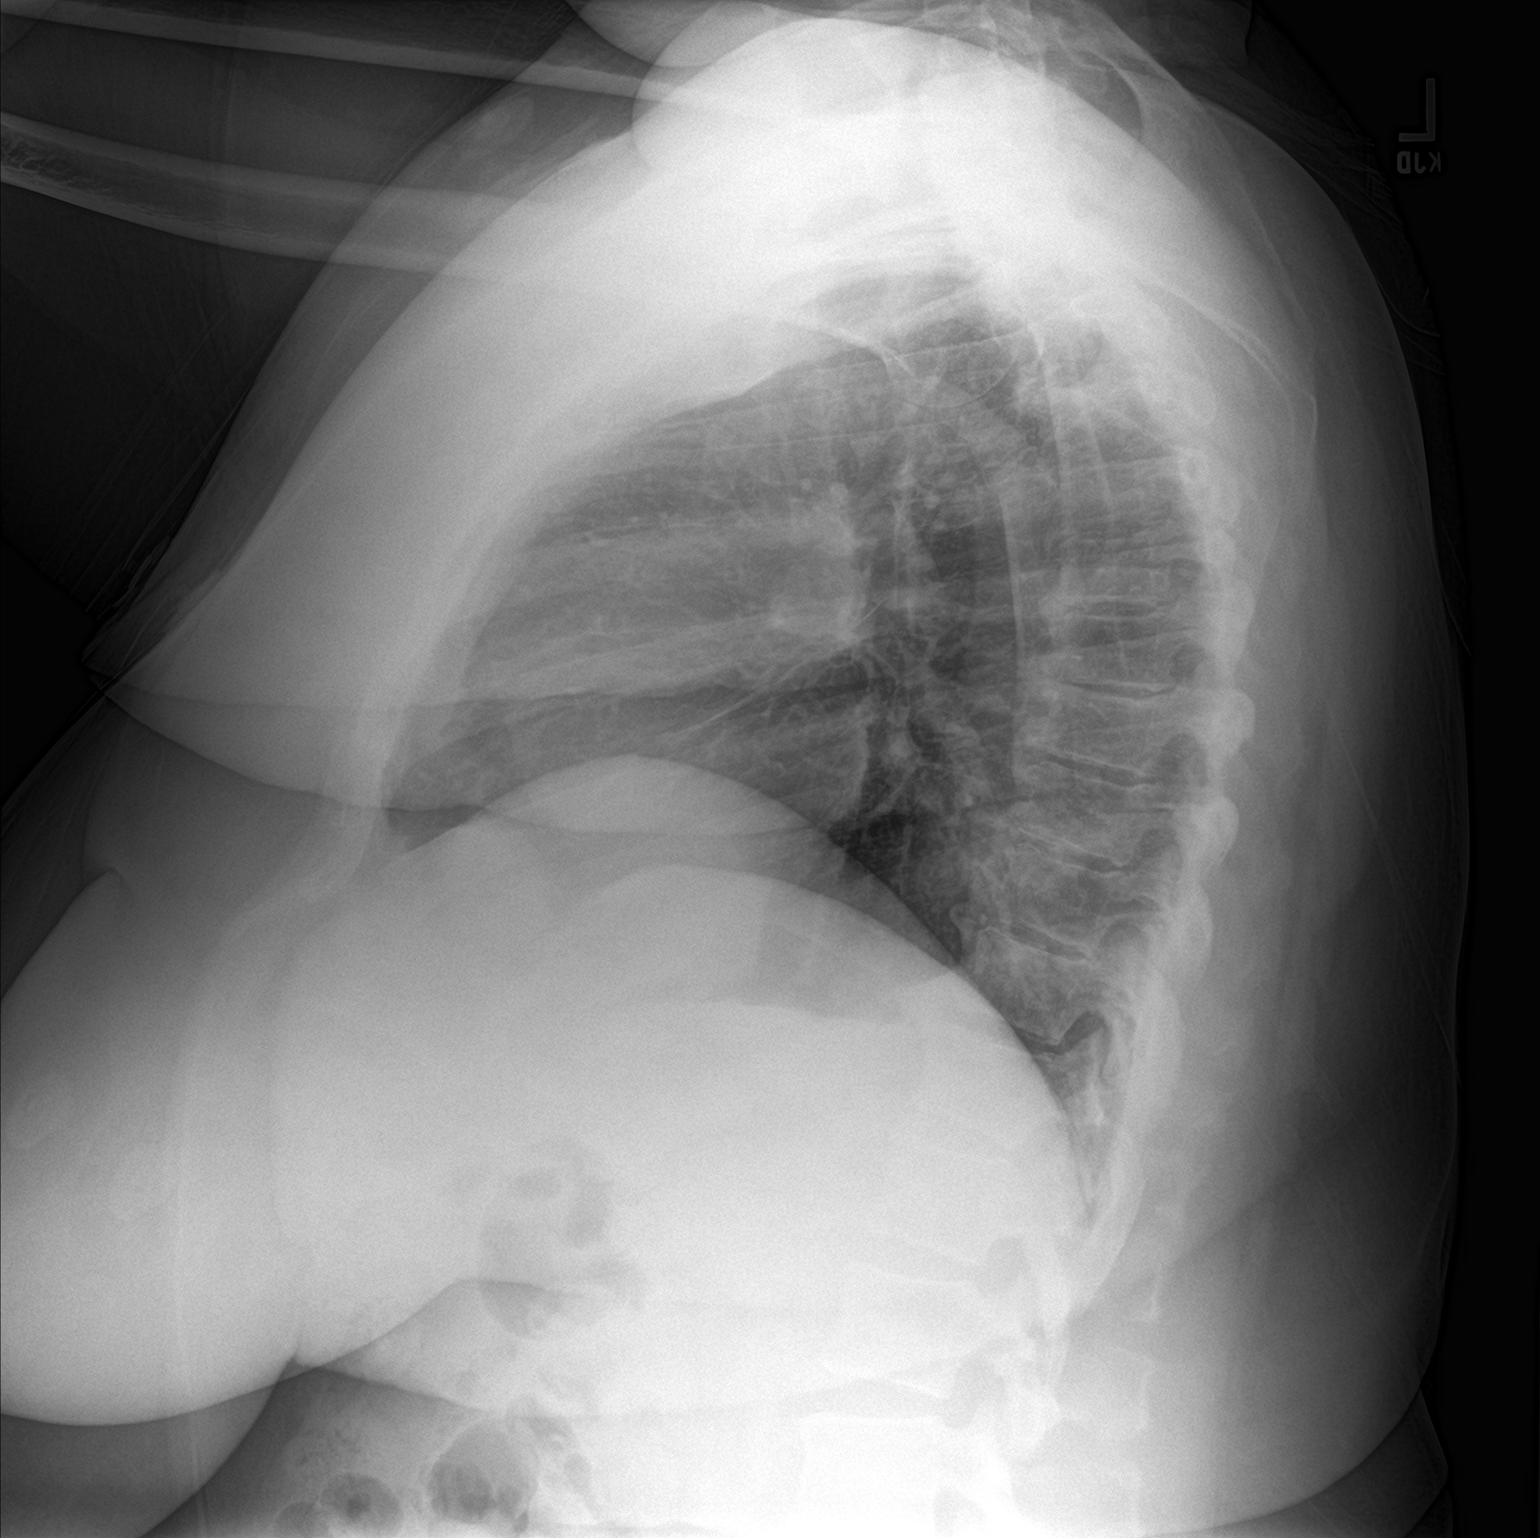

[2 of 2 positions shown; findings below may reference images not displayed]

FINDINGS: The heart size and mediastinal contours are within normal limits.
Both lungs are clear. The visualized skeletal structures are
unremarkable.
IMPRESSION: No active cardiopulmonary disease.

## 2021-04-12 ENCOUNTER — Ambulatory Visit: Payer: Self-pay | Admitting: Occupational Therapy
